# Patient Record
Sex: Female | Born: 2000 | Race: White | Hispanic: No | Marital: Single | Smoking: Current every day smoker
Health system: Southern US, Community
[De-identification: ages and names within clinical notes are randomized; demographics above are authoritative.]

## PROBLEM LIST (undated history)

## (undated) DIAGNOSIS — F419 Anxiety disorder, unspecified: Secondary | ICD-10-CM

## (undated) DIAGNOSIS — Z973 Presence of spectacles and contact lenses: Secondary | ICD-10-CM

## (undated) DIAGNOSIS — F4481 Dissociative identity disorder: Secondary | ICD-10-CM

## (undated) DIAGNOSIS — Z8489 Family history of other specified conditions: Secondary | ICD-10-CM

## (undated) DIAGNOSIS — L309 Dermatitis, unspecified: Secondary | ICD-10-CM

## (undated) HISTORY — PX: NO PAST SURGERIES: SHX2092

## (undated) HISTORY — DX: Dermatitis, unspecified: L30.9

---
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
## 2022-03-06 IMAGING — DX DG CHEST 1V PORT
1 series · 1 of 1 positions shown · non-contrast
Comparison: August 02, 2015

CLINICAL DATA: Shortness of breath.

EXAM:
PORTABLE CHEST 1 VIEW

[chest ap]
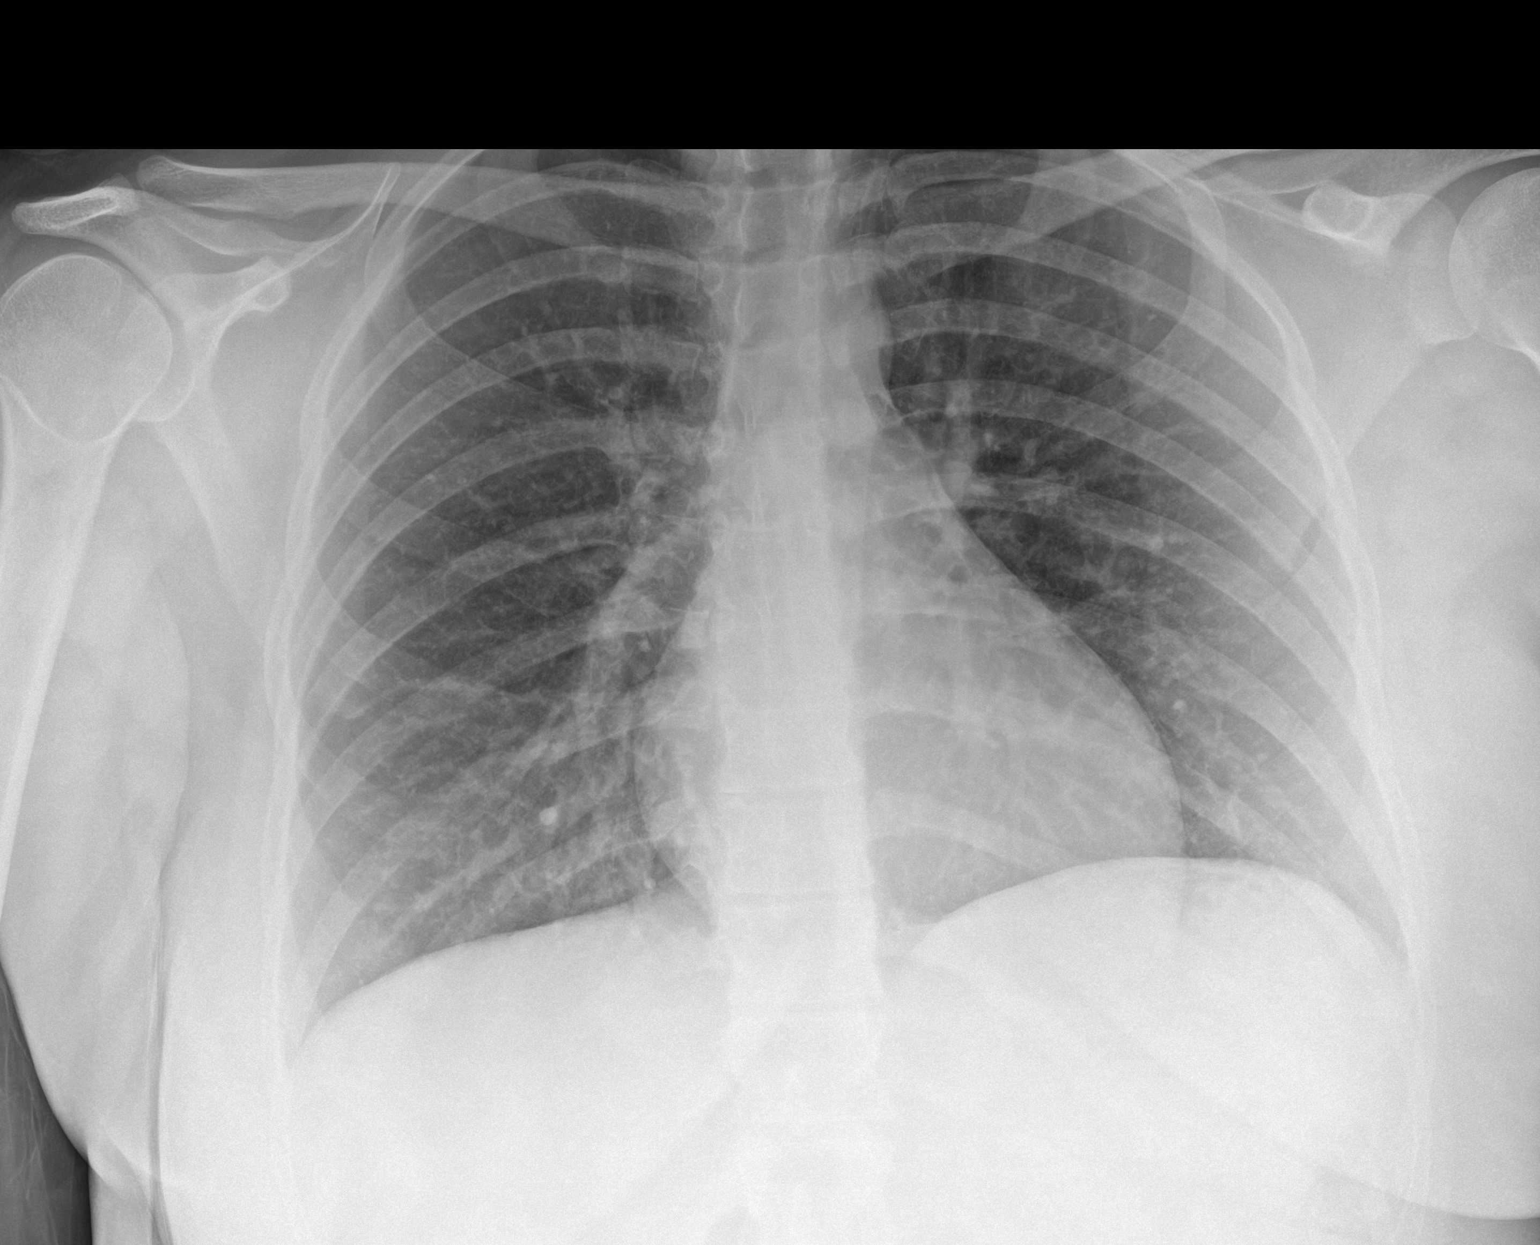

[1 of 1 positions shown; findings below may reference images not displayed]

FINDINGS: The heart size and mediastinal contours are within normal limits.
Both lungs are clear. The visualized skeletal structures are
unremarkable.
IMPRESSION: No active disease.

## 2022-05-16 ENCOUNTER — Emergency Department: Payer: No Typology Code available for payment source

## 2022-05-16 ENCOUNTER — Other Ambulatory Visit: Payer: Self-pay

## 2022-05-16 ENCOUNTER — Encounter: Payer: Self-pay | Admitting: Intensive Care

## 2022-05-16 ENCOUNTER — Emergency Department
Admission: EM | Admit: 2022-05-16 | Discharge: 2022-05-16 | Disposition: A | Discharge status: Home / Self Care | Payer: No Typology Code available for payment source | Attending: Emergency Medicine | Admitting: Emergency Medicine

## 2022-05-16 DIAGNOSIS — E860 Dehydration: Secondary | ICD-10-CM | POA: Insufficient documentation

## 2022-05-16 DIAGNOSIS — Z20822 Contact with and (suspected) exposure to covid-19: Secondary | ICD-10-CM | POA: Diagnosis not present

## 2022-05-16 DIAGNOSIS — R1115 Cyclical vomiting syndrome unrelated to migraine: Secondary | ICD-10-CM | POA: Diagnosis not present

## 2022-05-16 DIAGNOSIS — R1110 Vomiting, unspecified: Secondary | ICD-10-CM | POA: Diagnosis present

## 2022-05-16 LAB — CBC WITH DIFFERENTIAL/PLATELET
Abs Immature Granulocytes: 0.11 10*3/uL — ABNORMAL HIGH (ref 0.00–0.07)
Basophils Absolute: 0.1 10*3/uL (ref 0.0–0.1)
Basophils Relative: 0 %
Eosinophils Absolute: 0.1 10*3/uL (ref 0.0–0.5)
Eosinophils Relative: 0 %
HCT: 43.3 % (ref 36.0–46.0)
Hemoglobin: 13.6 g/dL (ref 12.0–15.0)
Immature Granulocytes: 1 %
Lymphocytes Relative: 7 %
Lymphs Abs: 1.7 10*3/uL (ref 0.7–4.0)
MCH: 26.5 pg (ref 26.0–34.0)
MCHC: 31.4 g/dL (ref 30.0–36.0)
MCV: 84.4 fL (ref 80.0–100.0)
Monocytes Absolute: 1.3 10*3/uL — ABNORMAL HIGH (ref 0.1–1.0)
Monocytes Relative: 6 %
Neutro Abs: 20.4 10*3/uL — ABNORMAL HIGH (ref 1.7–7.7)
Neutrophils Relative %: 86 %
Platelets: 300 10*3/uL (ref 150–400)
RBC: 5.13 MIL/uL — ABNORMAL HIGH (ref 3.87–5.11)
RDW: 14.1 % (ref 11.5–15.5)
WBC: 23.7 10*3/uL — ABNORMAL HIGH (ref 4.0–10.5)
nRBC: 0 % (ref 0.0–0.2)

## 2022-05-16 LAB — COMPREHENSIVE METABOLIC PANEL
ALT: 25 U/L (ref 0–44)
AST: 23 U/L (ref 15–41)
Albumin: 3.9 g/dL (ref 3.5–5.0)
Alkaline Phosphatase: 108 U/L (ref 38–126)
Anion gap: 10 (ref 5–15)
BUN: 11 mg/dL (ref 6–20)
CO2: 20 mmol/L — ABNORMAL LOW (ref 22–32)
Calcium: 9 mg/dL (ref 8.9–10.3)
Chloride: 105 mmol/L (ref 98–111)
Creatinine, Ser: 0.72 mg/dL (ref 0.44–1.00)
GFR, Estimated: >60 mL/min (ref >60)
Glucose, Bld: 116 mg/dL — ABNORMAL HIGH (ref 70–99)
Potassium: 3.9 mmol/L (ref 3.5–5.1)
Sodium: 135 mmol/L (ref 135–145)
Total Bilirubin: 0.5 mg/dL (ref 0.3–1.2)
Total Protein: 8 g/dL (ref 6.5–8.1)

## 2022-05-16 LAB — POC URINE PREG, ED: Preg Test, Ur: NEGATIVE

## 2022-05-16 LAB — URINALYSIS, ROUTINE W REFLEX MICROSCOPIC
Bacteria, UA: NONE SEEN
Bilirubin Urine: NEGATIVE
Glucose, UA: NEGATIVE mg/dL
Hgb urine dipstick: NEGATIVE
Ketones, ur: 20 mg/dL — AB
Nitrite: NEGATIVE
Protein, ur: 30 mg/dL — AB
Specific Gravity, Urine: 1.031 — ABNORMAL HIGH (ref 1.005–1.030)
pH: 5 (ref 5.0–8.0)

## 2022-05-16 LAB — RESP PANEL BY RT-PCR (RSV, FLU A&B, COVID)  RVPGX2
Influenza A by PCR: NEGATIVE
Influenza B by PCR: NEGATIVE
Resp Syncytial Virus by PCR: NEGATIVE
SARS Coronavirus 2 by RT PCR: NEGATIVE

## 2022-05-16 LAB — LIPASE, BLOOD: Lipase: 32 U/L (ref 11–51)

## 2022-05-16 MED ORDER — PANTOPRAZOLE SODIUM 20 MG PO TBEC: 20.0000 mg | DELAYED_RELEASE_TABLET | Freq: Every day | ORAL | 3 refills | Status: AC

## 2022-05-16 MED ORDER — ONDANSETRON HCL 4 MG/2ML IJ SOLN
4.0000 mg | Freq: Once | INTRAMUSCULAR | Status: AC
  Administered 2022-05-16: 4 mg via INTRAVENOUS
  Filled 2022-05-16: qty 2

## 2022-05-16 MED ORDER — ONDANSETRON 4 MG PO TBDP: 4.0000 mg | ORAL_TABLET | Freq: Three times a day (TID) | ORAL | 2 refills | Status: AC | PRN

## 2022-05-16 MED ORDER — SODIUM CHLORIDE 0.9 % IV BOLUS
1000.0000 mL | Freq: Once | INTRAVENOUS | Status: AC
  Administered 2022-05-16: 1000 mL via INTRAVENOUS

## 2022-05-16 MED ORDER — IOHEXOL 300 MG/ML  SOLN
100.0000 mL | Freq: Once | INTRAMUSCULAR | Status: AC | PRN
  Administered 2022-05-16: 100 mL via INTRAVENOUS

## 2022-05-16 NOTE — Discharge Instructions (Addendum)
Keep a diary of all the foods that you eat and list any type of reactions you are having after eating these foods such as headaches, abdominal pain, heartburn, nausea, or vomiting. Try taking the Protonix daily.  This is best taken 30 minutes prior to dinner. Take the Zofran ODT as needed for nausea. Return emergency department worsening Please call Dr. Georgeann Oppenheim office for an appointment.

## 2022-05-16 NOTE — ED Notes (Signed)
US at bedside

## 2022-05-16 NOTE — ED Notes (Signed)
PT to CT.

## 2022-05-16 NOTE — ED Notes (Signed)
Pt arrived with complaint of vomiting and diarrhea. Pt states she has episodes of vomiting for a day and then will be fine. Pt states she has been seen for this before and it was doing good but now has had multiple episodes again and missing work. Pt denies blood in emesis or stools. Pt reports there is a possibility she could be pregnant due to being out of her oral contraceptive for a while but took 2 at home tests that were negative. Pt states she does not know the date her LMP. Pt denies SOB, CP. Pt A&Ox4.

## 2022-05-16 NOTE — ED Provider Notes (Signed)
[facility] Provider Note    Event Date/Time   First MD Initiated Contact with Patient 05/16/22 0957     (approximate)   History   Diarrhea and Emesis   HPI  Tina Shaffer is a 22 y.o. adult with history of cyclic vomiting syndrome, anxiety, ADHD presents emergency department with vomiting and diarrhea that started this morning.  Patient states she has felt a little dizzy when going up and down steps.  She denies any chest pain or shortness of breath.  No marijuana use.  No use of CBD Gummies.  No recent EtOH use.      Physical Exam   Triage Vital Signs: ED Triage Vitals  Enc Vitals Group     BP 05/16/22 0951 124/66     Pulse Rate 05/16/22 0951 75     Resp 05/16/22 0951 16     Temp 05/16/22 0951 (!) 97.5 F (36.4 C)     Temp Source 05/16/22 0951 Oral     SpO2 05/16/22 0951 96 %     Weight 05/16/22 0941 229 lb (103.9 kg)     Height 05/16/22 0941 5' 5"$  (1.651 m)     Head Circumference --      Peak Flow --      Pain Score 05/16/22 0941 2     Pain Loc --      Pain Edu? --      Excl. in [place]? --     Most recent vital signs: Vitals:   05/16/22 0951  BP: 124/66  Pulse: 75  Resp: 16  Temp: (!) 97.5 F (36.4 C)  SpO2: 96%     General: Awake, no distress.   CV:  Good peripheral perfusion. regular rate and  rhythm Resp:  Normal effort. Lungs cta Abd:  No distention.  Nontender bowel sounds normal Other:      ED Results / Procedures / Treatments   Labs (all labs ordered are listed, but only abnormal results are displayed) Labs Reviewed  COMPREHENSIVE METABOLIC PANEL - Abnormal; Notable for the following components:      Result Value   CO2 20 (*)    Glucose, Bld 116 (*)    All other components within normal limits  CBC WITH DIFFERENTIAL/PLATELET - Abnormal; Notable for the following components:   WBC 23.7 (*)    RBC 5.13 (*)    Neutro Abs 20.4 (*)    Monocytes Absolute 1.3 (*)    Abs Immature Granulocytes 0.11 (*)    All other  components within normal limits  URINALYSIS, ROUTINE W REFLEX MICROSCOPIC - Abnormal; Notable for the following components:   Color, Urine YELLOW (*)    APPearance CLOUDY (*)    Specific Gravity, Urine 1.031 (*)    Ketones, ur 20 (*)    Protein, ur 30 (*)    Leukocytes,Ua TRACE (*)    All other components within normal limits  RESP PANEL BY RT-PCR (RSV, FLU A&B, COVID)  RVPGX2  LIPASE, BLOOD  URINALYSIS, W/ REFLEX TO CULTURE (INFECTION SUSPECTED)  POC URINE PREG, ED     EKG     RADIOLOGY Ultrasound right upper quadrant for gallbladder    PROCEDURES:   Procedures   MEDICATIONS ORDERED IN ED: Medications  sodium chloride 0.9 % bolus 1,000 mL (0 mLs Intravenous Stopped 05/16/22 1140)  ondansetron (ZOFRAN) injection 4 mg (4 mg Intravenous Given 05/16/22 1025)  iohexol (OMNIPAQUE) 300 MG/ML solution 100 mL (100 mLs Intravenous Contrast Given 05/16/22 1240)  IMPRESSION / MDM / ASSESSMENT AND PLAN / ED COURSE  I reviewed the triage vital signs and the nursing notes.                              Differential diagnosis includes, but is not limited to, cyclic vomiting, gastroenteritis, COVID, influenza, acute cholecystitis, gallstones, pancreatitis, UTI  Patient's presentation is most consistent with acute presentation with potential threat to life or bodily function.   Labs and imaging ordered, patient being given normal saline 1 L IV, Zofran 4 mg IV   Labs are reassuring Ultrasound right upper quadrant, I did review the radiologist reading and I interpret this as being negative CT abdomen/pelvis with IV contrast, I reviewed the radiologist reading and I interpret this as being negative  I did explain these findings to the patient.  She had a lot of relief with the Zofran and fluids.  Concerns that she may actually have like a food allergy that is causing the cyclic vomiting.  Feel that she would benefit from a follow-up with a GI doctor.  Do not feel that she needs to  be admitted at this time.  I did order a urine culture she had trace amount of leuks along with elevated WBC to ensure there is not an occult UTI.  Patient is in agreement treatment plan.  She was given a work note, prescription for Protonix and Zofran and discharged in stable condition.  She is to keep a diary of all foods and any reaction she has to these foods.   FINAL CLINICAL IMPRESSION(S) / ED DIAGNOSES   Final diagnoses:  Dehydration  Cyclical vomiting syndrome not associated with migraine     Rx / DC Orders   ED Discharge Orders          Ordered    pantoprazole (PROTONIX) 20 MG tablet  Daily        05/16/22 1324    ondansetron (ZOFRAN-ODT) 4 MG disintegrating tablet  Every 8 hours PRN        05/16/22 1324             Note:  This document was prepared using Dragon voice recognition software and may include unintentional dictation errors.    Versie Starks, PA-C 05/16/22 1335    Naaman Plummer, MD 05/16/22 [phone]

## 2022-05-16 NOTE — ED Triage Notes (Signed)
Patient c/o diarrhea and emesis since this AM.

## 2022-05-18 LAB — URINE CULTURE
